# Patient Record
Sex: Male | Born: 1940 | Race: White | Hispanic: No | Smoking: Former smoker
Health system: Southern US, Community
[De-identification: ages and names within clinical notes are randomized; demographics above are authoritative.]

## PROBLEM LIST (undated history)

## (undated) DIAGNOSIS — E039 Hypothyroidism, unspecified: Secondary | ICD-10-CM

## (undated) DIAGNOSIS — I10 Essential (primary) hypertension: Secondary | ICD-10-CM

## (undated) DIAGNOSIS — H548 Legal blindness, as defined in USA: Secondary | ICD-10-CM

## (undated) HISTORY — DX: Essential (primary) hypertension: I10

## (undated) HISTORY — DX: Legal blindness, as defined in USA: H54.8

## (undated) HISTORY — PX: EYE SURGERY: SHX253

## (undated) HISTORY — PX: HEMORRHOID BANDING: SHX5850

## (undated) HISTORY — DX: Hypothyroidism, unspecified: E03.9

---

## 2018-01-21 ENCOUNTER — Encounter: Payer: Self-pay | Admitting: Sports Medicine

## 2018-01-21 ENCOUNTER — Ambulatory Visit (INDEPENDENT_AMBULATORY_CARE_PROVIDER_SITE_OTHER): Payer: Medicare Other | Admitting: Sports Medicine

## 2018-01-21 ENCOUNTER — Ambulatory Visit (INDEPENDENT_AMBULATORY_CARE_PROVIDER_SITE_OTHER): Payer: Medicare Other

## 2018-01-21 VITALS — BP 122/80 | HR 61 | Ht 70.0 in | Wt 166.6 lb

## 2018-01-21 DIAGNOSIS — S82891A Other fracture of right lower leg, initial encounter for closed fracture: Secondary | ICD-10-CM | POA: Diagnosis not present

## 2018-01-21 DIAGNOSIS — M25571 Pain in right ankle and joints of right foot: Secondary | ICD-10-CM

## 2018-01-21 NOTE — Progress Notes (Addendum)
Veverly FellsMichael D. Delorise Shinerigby, DO  [facility] [phone]  Zachary Hebert - 77 y.o. male MRN [id number]  Date of birth: 1941-06-17  Visit Date: 01/21/2018  PCP: Herby AbrahamHipp, David, MD   Referred by: No ref. provider found   Scribe for today's visit: Christoper FabianMolly Weber, LAT, ATC     SUBJECTIVE:  Zachary Hebert is here for New Patient (Initial Visit) (ankle pain) .    R ankle pain  symptoms INITIALLY: Began about 2 weeks ago after falling off a curb at [employer] Described as 5/10 throbbing pain at the R lateral malleolus, radiating to R foot/lower leg Worsened with weight bearing Improved with rest and elevation Additional associated symptoms include: tingling in toes but no popping or cracking    At this time symptoms are improving compared to onset  He has been taking Aspirin and using both ice and epsom salts   ROS Denies night time disturbances. Denies fevers, chills, or night sweats. Denies unexplained weight loss. Denies personal history of cancer. Denies changes in bowel or bladder habits. Reports recent fall as above Denies new or worsening dyspnea or wheezing. Denies headaches or dizziness.  Denies numbness, tingling or weakness  In the extremities.  Denies dizziness or presyncopal episodes Reports lower extremity edema.  Yes in the R LE, ankle and foot   HISTORY & PERTINENT PRIOR DATA:  Prior History reviewed and updated per electronic medical record.  Significant history, findings, studies and interim changes include:  reports that he has quit smoking. he has never used smokeless tobacco. No results for input(s): HGBA1C, LABURIC, CREATINE in the last 8760 hours. No specialty comments available. Problem  Closed Fracture of Right Ankle    OBJECTIVE:  VS:  HT:5\' 10"  (177.8 cm)   WT:166 lb 9.6 oz (75.6 kg)  BMI:23.9    BP:122/80  HR:61bpm  TEMP: ( )  RESP:96 %   PHYSICAL EXAM: Constitutional: WDWN, Non-toxic  appearing. Psychiatric: Alert & appropriately interactive.  Not depressed or anxious appearing. Respiratory: No increased work of breathing.  Trachea Midline Eyes: Pupils are equal.  EOM intact without nystagmus.  No scleral icterus  NEUROVASCULAR exam: No clubbing or cyanosis appreciated No significant venous stasis changes Capillary Refill: normal, less than 2 seconds   Right ankle: Marked swelling over the lateral malleolus.  He has marked pain in this area.  He has limited ankle dorsiflexion plantarflexion.  Pain with palpation of the syndesmosis which is mild.  Ankle drawer testing is stable.  He has no pain along the base of the fifth metatarsal, navicular or lateral malleolus.  No calcaneal pain.  DP and PT pulses are palpable.     ASSESSMENT & PLAN:   1. Acute right ankle pain   2. Closed fracture of right ankle, initial encounter    PLAN:    Closed fracture of right ankle Posterior malleolus nondisplaced fracture.  We will have him begin with boot immobilization.  He has been walking on this for 2 weeks and we discussed that 6 weeks of immobilization from the time of the fracture is recommended.  Recommend wearing the boot 24 hours a day but he will likely drive without the boot on.  Recommend nighttime wear.  New fracture boot provided for him today.  Tylenol as needed.   ++++++++++++++++++++++++++++++++++++++++++++ Orders & Meds: Orders Placed This Encounter  Procedures  . DG Ankle Complete Right    No orders of the defined types were placed in this  encounter.   ++++++++++++++++++++++++++++++++++++++++++++ Follow-up: Return in about 2 weeks (around 02/04/2018) for repeat X-rays.   Pertinent documentation may be included in additional procedure notes, imaging studies, problem based documentation and patient instructions. Please see these sections of the encounter for additional information regarding this visit. CMA/ATC served as Neurosurgeon during this visit. History,  Physical, and Plan performed by medical provider. Documentation and orders reviewed and attested to.      Andrena Mews, DO    [facility]ician

## 2018-01-21 NOTE — Assessment & Plan Note (Signed)
Posterior malleolus nondisplaced fracture.  We will have him begin with boot immobilization.  He has been walking on this for 2 weeks and we discussed that 6 weeks of immobilization from the time of the fracture is recommended.  Recommend wearing the boot 24 hours a day but he will likely drive without the boot on.  Recommend nighttime wear.  New fracture boot provided for him today.  Tylenol as needed.

## 2018-02-03 ENCOUNTER — Encounter: Payer: Self-pay | Admitting: Sports Medicine

## 2018-02-03 ENCOUNTER — Ambulatory Visit (INDEPENDENT_AMBULATORY_CARE_PROVIDER_SITE_OTHER): Payer: Medicare Other | Admitting: Sports Medicine

## 2018-02-03 ENCOUNTER — Ambulatory Visit (INDEPENDENT_AMBULATORY_CARE_PROVIDER_SITE_OTHER): Payer: Medicare Other

## 2018-02-03 VITALS — BP 122/74 | HR 57 | Ht 70.0 in | Wt 170.0 lb

## 2018-02-03 DIAGNOSIS — S82891D Other fracture of right lower leg, subsequent encounter for closed fracture with routine healing: Secondary | ICD-10-CM | POA: Diagnosis not present

## 2018-02-03 NOTE — Progress Notes (Signed)
Zachary FellsMichael D. Zachary Shinerigby, DO  [facility] [phone]  Zachary Hebert - 77 y.o. male MRN [id number]  Date of birth: 18-Apr-1941  Visit Date: 02/03/2018  PCP: Zachary AbrahamHipp, David, MD   Referred by: Zachary AbrahamHipp, David, MD   Scribe for today's visit: Zachary Hebert, CMA     SUBJECTIVE:  Zachary Hebert is here for Follow-up (closed malleolar fx)  01/21/18: R ankle pain symptoms INITIALLY: Began about 2 weeks ago after falling [place] a curb at [employer] Described as 5/10 throbbing pain at the R lateral malleolus, radiating to R foot/lower leg Worsened with weight bearing Improved with rest and elevation Additional associated symptoms include: tingling in toes but no popping or cracking At this time symptoms are improving compared to onset  He has been taking Aspirin and using both ice and epsom salts   02/03/18: Compared to the last office visit, his previously described symptoms are improving, hardly any pain, still some swelling. He has tried walking with a sandal on with little discomfort.  Current symptoms are mild & are nonradiating He has been wearing boot. He takes Aspirin every other night.    ROS Denies night time disturbances. Denies fevers, chills, or night sweats. Denies unexplained weight loss. Denies personal history of cancer. Denies changes in bowel or bladder habits. Denies recent unreported falls. Denies new or worsening dyspnea or wheezing. Denies headaches or dizziness.  Denies numbness, tingling or weakness  In the extremities.  Denies dizziness or presyncopal episodes Reports lower extremity edema     HISTORY & PERTINENT PRIOR DATA:  Prior History reviewed and updated per electronic medical record.  Significant history, findings, studies and interim changes include:  reports that he has quit smoking. he has never used smokeless tobacco. No results for input(s): HGBA1C, LABURIC, CREATINE in the last 8760 hours. No  specialty comments available. No problems updated.  OBJECTIVE:  VS:  HT:5\' 10"  (177.8 cm)   WT:170 lb (77.1 kg)  BMI:24.39    BP:122/74  HR:(!) 57bpm  TEMP: ( )  RESP:97 %   PHYSICAL EXAM: Constitutional: WDWN, Non-toxic appearing. Psychiatric: Alert & appropriately interactive.  Not depressed or anxious appearing. Respiratory: No increased work of breathing.  Trachea Midline Eyes: Pupils are equal.  EOM intact without nystagmus.  No scleral icterus  NEUROVASCULAR exam: No clubbing or cyanosis appreciated No significant venous stasis changes Capillary Refill: normal, less than 2 seconds   Overall is doing quite well.  His right ankle has significantly improved from swelling and discomfort.  He has only minimal pain with palpation of the anterior medial and anterior joint lines.  Small amount of pain over the anterior syndesmosis.  He has limited dorsiflexion and plantarflexion.  DP and PT pulses 2+/4.  Small amount of pain over the proximal IP joint of the second toe.  Otherwise midfoot and ankle are normal appearing.  He does have high cavus arch.   ASSESSMENT & PLAN:   1. Closed fracture of right ankle with routine healing, subsequent encounter    ++++++++++++++++++++++++++++++++++++++++++++ Orders & Meds:  Orders Placed This Encounter  Procedures  . DG Ankle Complete Right   No orders of the defined types were placed in this encounter.   ++++++++++++++++++++++++++++++++++++++++++++ PLAN:   Findings:  His ankle is doing well.  We will have him continue with boot immobilization for the next 2 weeks especially while out of the house.  He is able to come out of this and work on  gentle range of motion when he is at home.  We will plan to see him back in 2 weeks and perform a repeat clinical exam at that time.  If any persistent focal bony tenderness will consider transitioning into an ASO otherwise we will plan to discuss compression.  Discussed that this will likely persist for  even a year after an ankle fracture like this but otherwise he should do well.  Given his high cavus foot he also should be in over-the-counter longitudinal arch support especially given the changes within the second toes are consistent with hammer toe.   No problem-specific Assessment & Plan notes found for this encounter.   Follow-up: Return in about 2 weeks (around 02/17/2018).   Pertinent documentation may be included in additional procedure notes, imaging studies, problem based documentation and patient instructions. Please see these sections of the encounter for additional information regarding this visit. CMA/ATC served as Neurosurgeon during this visit. History, Physical, and Plan performed by medical provider. Documentation and orders reviewed and attested to.      Zachary Mews, DO    [facility] Physician

## 2018-02-04 ENCOUNTER — Ambulatory Visit: Payer: Medicare Other | Admitting: Sports Medicine

## 2018-02-05 NOTE — Progress Notes (Signed)
There is a persistent area within the posterior malleolus that is likely reflective of a resolving fracture.  No further x-rays will be obtained as long as he continues to improve as well as he has.

## 2018-02-06 NOTE — Progress Notes (Signed)
Noted  

## 2018-02-17 ENCOUNTER — Ambulatory Visit (INDEPENDENT_AMBULATORY_CARE_PROVIDER_SITE_OTHER): Payer: Medicare Other | Admitting: Sports Medicine

## 2018-02-17 ENCOUNTER — Encounter: Payer: Self-pay | Admitting: Sports Medicine

## 2018-02-17 VITALS — BP 136/82 | HR 57 | Ht 70.0 in | Wt 176.2 lb

## 2018-02-17 DIAGNOSIS — S82891D Other fracture of right lower leg, subsequent encounter for closed fracture with routine healing: Secondary | ICD-10-CM | POA: Diagnosis not present

## 2018-02-17 DIAGNOSIS — M25571 Pain in right ankle and joints of right foot: Secondary | ICD-10-CM

## 2018-02-17 NOTE — Progress Notes (Signed)
Zachary Hebert. [facility] [phone]  Zachary Hebert - 77 y.o. male MRN [id number]  Date of birth: 1941-02-10  Visit Date: 02/17/2018  PCP: Zachary Abraham, MD   Referred by: Zachary Abraham, MD   Scribe for today's visit: Zachary Hebert, CMA     SUBJECTIVE:  Zachary Hebert is here for Follow-up (R ankle fx)  01/21/18: R ankle pain symptoms INITIALLY: Began about 2 weeks ago after falling off a curb at [employer] Described as 5/10 throbbing pain at the R lateral malleolus, radiating to R foot/lower leg Worsened with weight bearing Improved with rest and elevation Additional associated symptoms include: tingling in toes but no popping or cracking At this time symptoms are improving compared to onset  He has been taking Aspirin and using both ice and epsom salts   02/03/18: Compared to the last office visit, his previously described symptoms are improving, hardly any pain, still some swelling. He has tried walking with a sandal on with little discomfort.  Current symptoms are mild & are nonradiating He has been wearing boot. He takes Aspirin every other night.   02/17/18: Compared to the last office visit, his previously described symptoms are improving, he is able to drive and walk without boot with no pain.  Current symptoms are mild & are nonradiating. Pt reports no swelling.  He has been wearing boot regularly throughout the day but takes it off at bedtime. He has not purchased longitiduinal arch support but has been wearing compression socks.    ROS Denies night time disturbances. Denies fevers, chills, or night sweats. Denies unexplained weight loss. Denies personal history of cancer. Denies changes in bowel or bladder habits. Denies recent unreported falls. Denies new or worsening dyspnea or wheezing. Denies headaches or dizziness.  Denies numbness, tingling or weakness  In the extremities.  Denies  dizziness or presyncopal episodes Denies lower extremity edema    HISTORY & PERTINENT PRIOR DATA:  Prior History reviewed and updated per electronic medical record.  Significant history, findings, studies and interim changes include:  reports that he has quit smoking. he has never used smokeless tobacco. No results for input(s): HGBA1C, LABURIC, CREATINE in the last 8760 hours. No specialty comments available. No problems updated.  OBJECTIVE:  VS:  HT:5\' 10"  (177.8 cm)   WT:176 lb 3.2 oz (79.9 kg)  BMI:25.28    BP:136/82  HR:(!) 57bpm  TEMP: ( )  RESP:97 %   PHYSICAL EXAM: Constitutional: WDWN, Non-toxic appearing. Psychiatric: Alert & appropriately interactive.  Not depressed or anxious appearing. Respiratory: No increased work of breathing.  Trachea Midline Eyes: Pupils are equal.  EOM intact without nystagmus.  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  He has no focal bony tenderness.  He has good dorsiflexion and plantarflexion strength is range of motion is only slightly limited compared to the left.  Inversion and eversion strength is 5+/5.  No significant lower extremity edema.   ASSESSMENT & PLAN:   1. Closed fracture of right ankle with routine healing, subsequent encounter   2. Acute right ankle pain     PLAN: He is done great with this.  We did discuss that degenerative arthritis can flareup from time to time and if he ever has any return  of significant symptoms injection is a possibility for this.  Continue with compression and arch support over-the-counter as needed but he will call us if any ongoing issues.  Follow-up: Return for as needed for ongoing issues.   Pertinent documentation may be included in additional procedure notes, imaging  studies, problem based documentation and patient instructions. Please see these sections of the encounter for additional information regarding this visit. CMA/ATC served as Neurosurgeonscribe during this visit. History, Physical, and Plan performed by medical provider. Documentation and orders reviewed and attested to.      Zachary MewsMichael D Lofton Leon, DO    [facility] Physician

## 2019-09-19 IMAGING — DX DG ANKLE COMPLETE 3+V*R*
3 series · 3 of 3 positions shown · non-contrast
Comparison: No recent.

CLINICAL DATA: Right ankle injury.  Pain swelling.

EXAM:
RIGHT ANKLE - COMPLETE 3+ VIEW

[ankle ap]
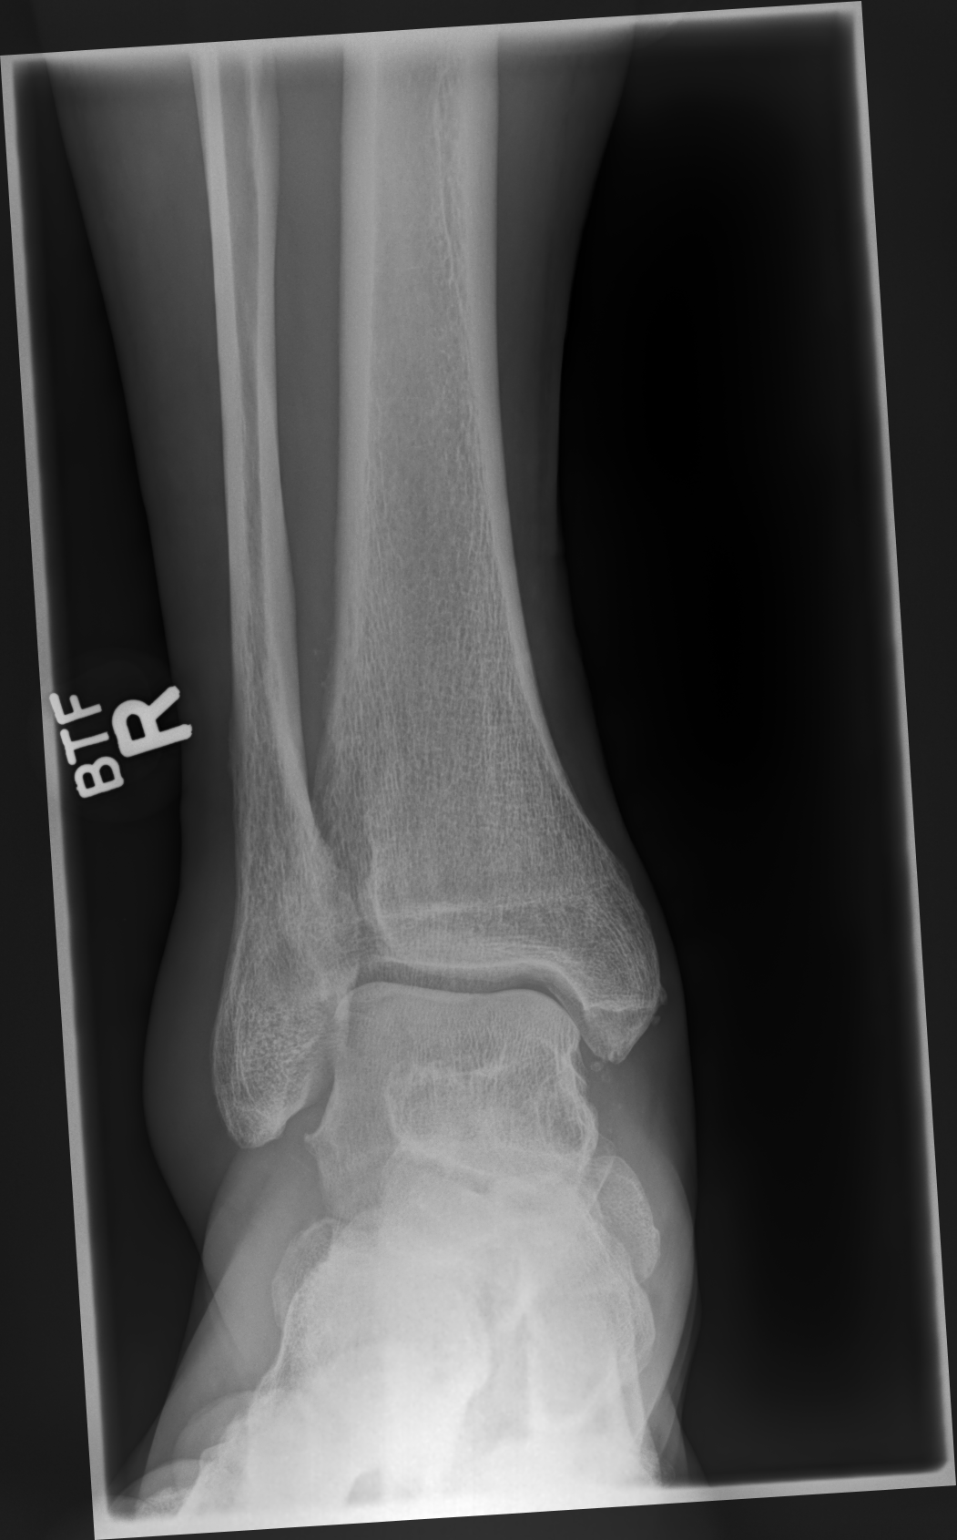

[ankle oblique]
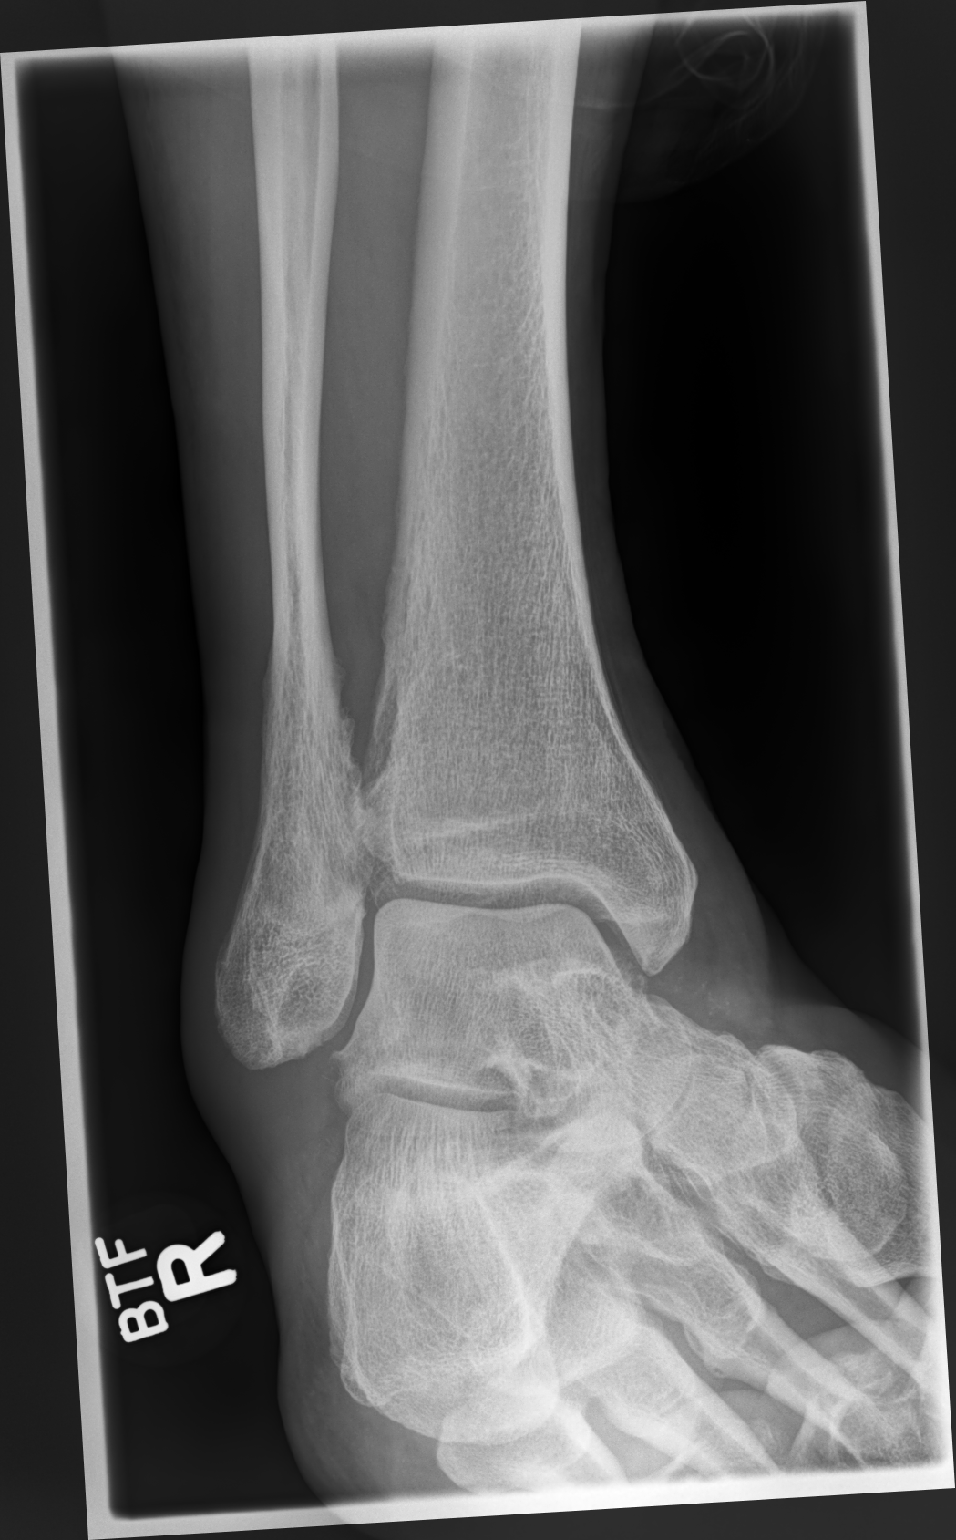

[ankle lat]
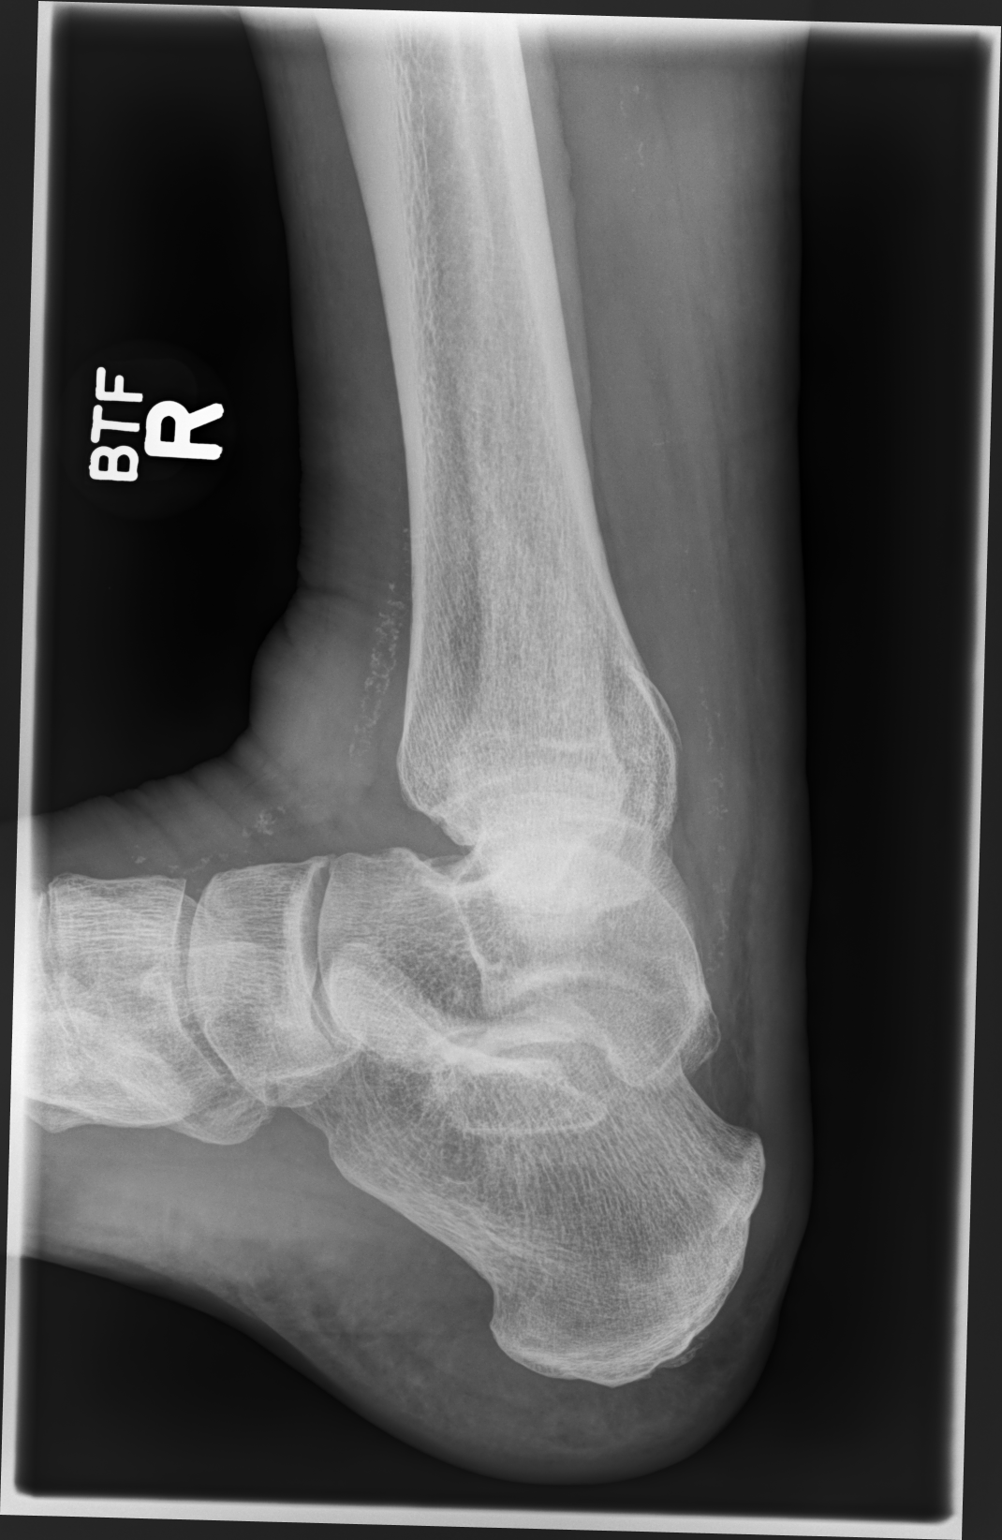

[3 of 3 positions shown; findings below may reference images not displayed]

FINDINGS: Soft tissue swelling scratched it diffuse soft tissue swelling,
particularly prominent over the lateral malleolus. Tiny corticated
bony densities noted adjacent to the medial malleolus most likely
tiny old fracture fragments. No definite acute fractures identified.
Degenerative changes noted about the right ankle. Peripheral
vascular calcification.
IMPRESSION: 1. Diffuse soft tissue swelling, particularly prominent over the
lateral malleolus. Tiny bony scratched it tiny corticated bony
densities noted adjacent to the medial malleolus, most likely old
fracture fragments. No acute fractures identified. Degenerative
changes right ankle.

2.  Peripheral vascular disease.

## 2019-10-02 IMAGING — DX DG ANKLE COMPLETE 3+V*R*
3 series · 3 of 3 positions shown · non-contrast
Comparison: 01/21/2018

CLINICAL DATA: Two week fracture follow-up

EXAM:
RIGHT ANKLE - COMPLETE 3+ VIEW

[ankle ap]
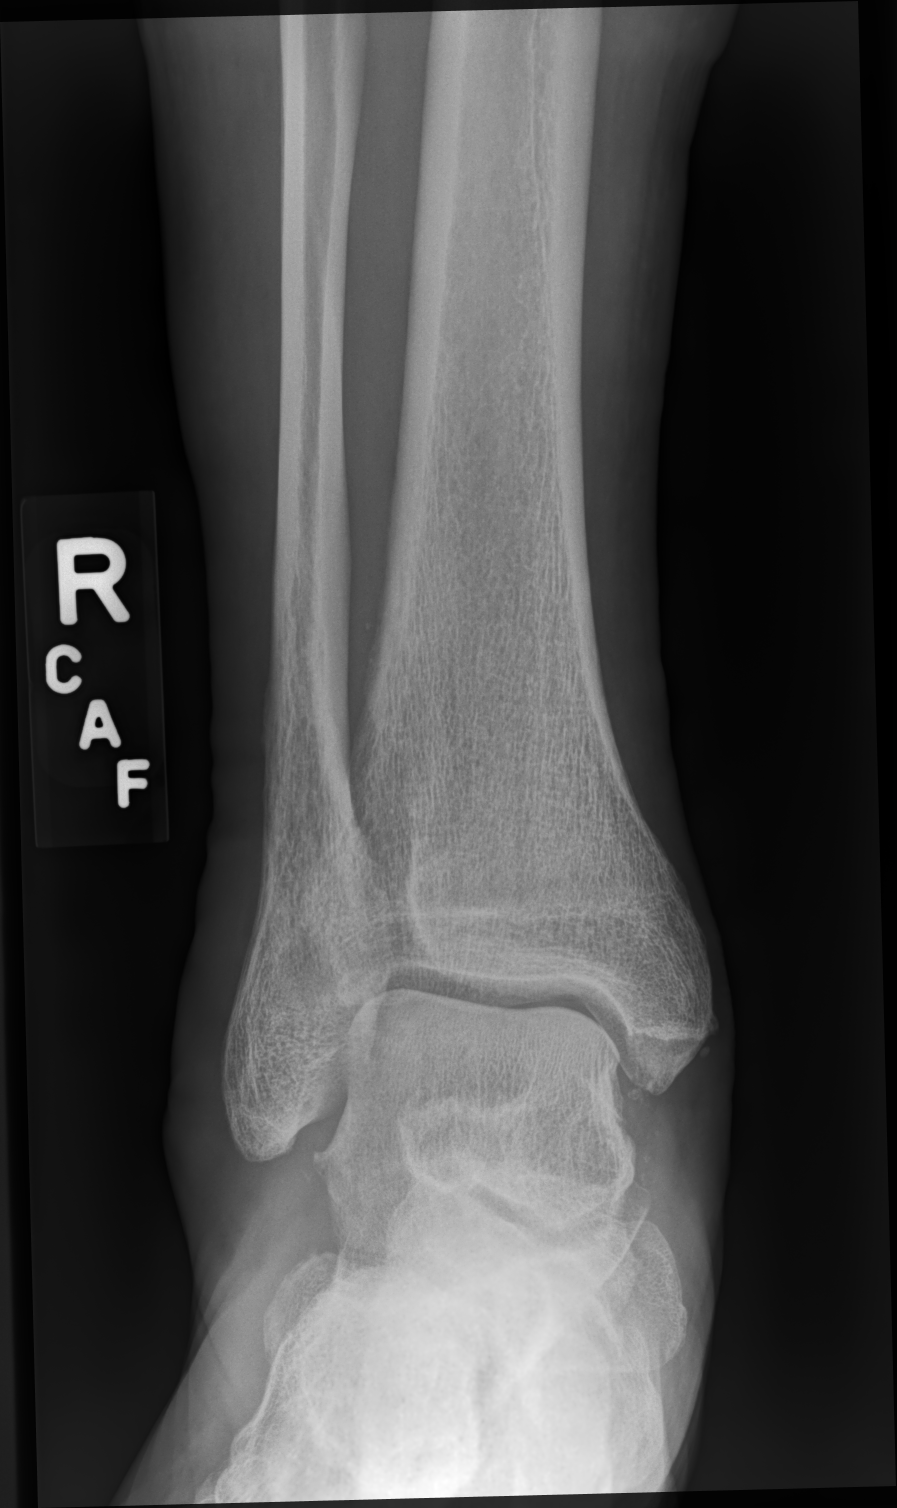

[ankle oblique]
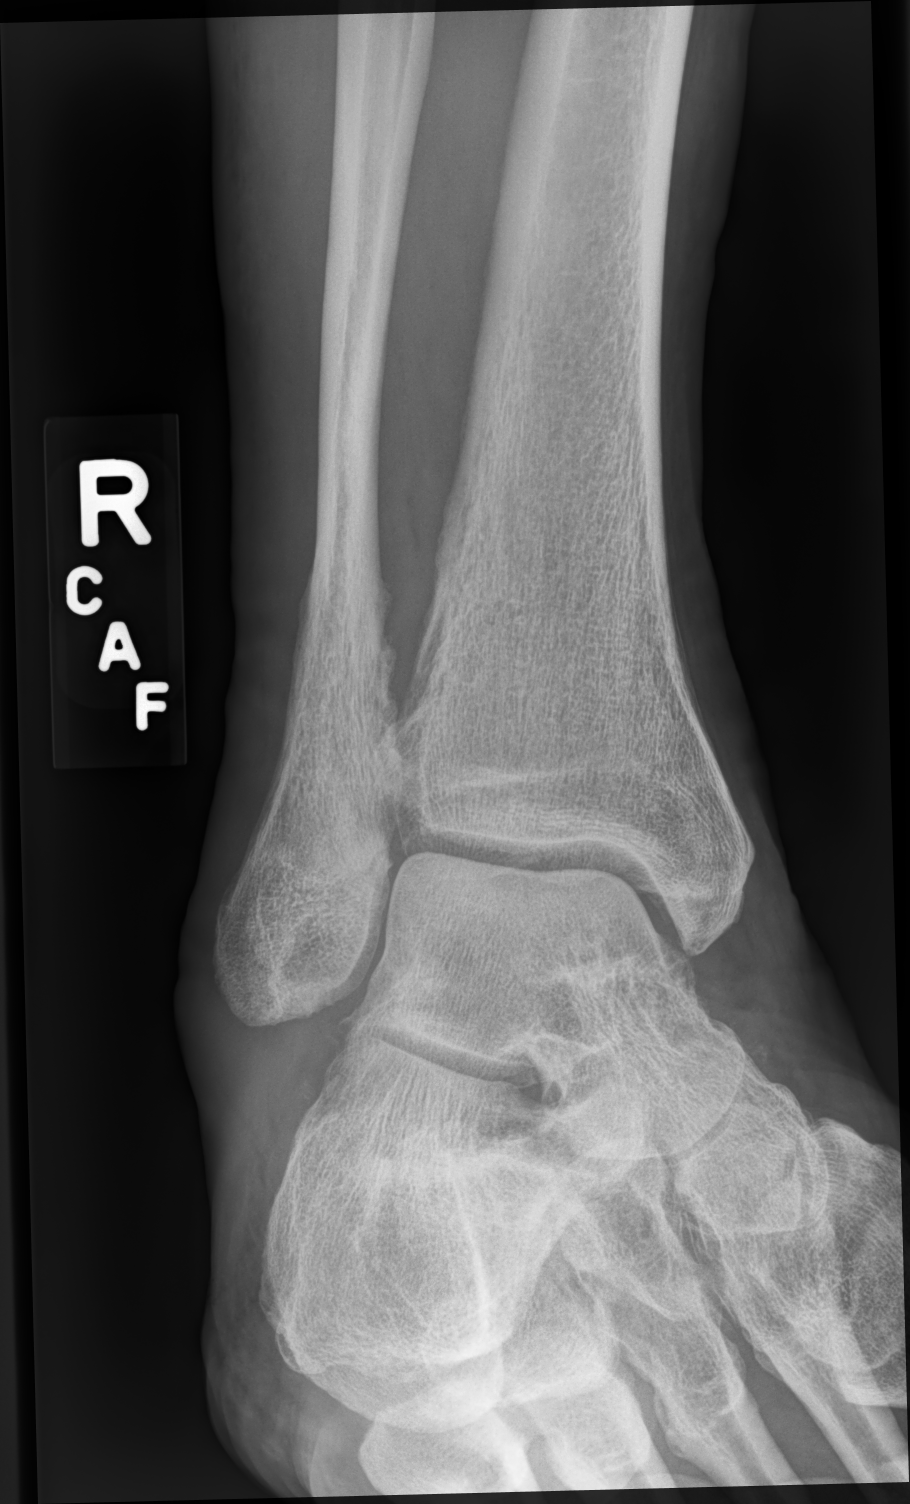

[ankle lat]
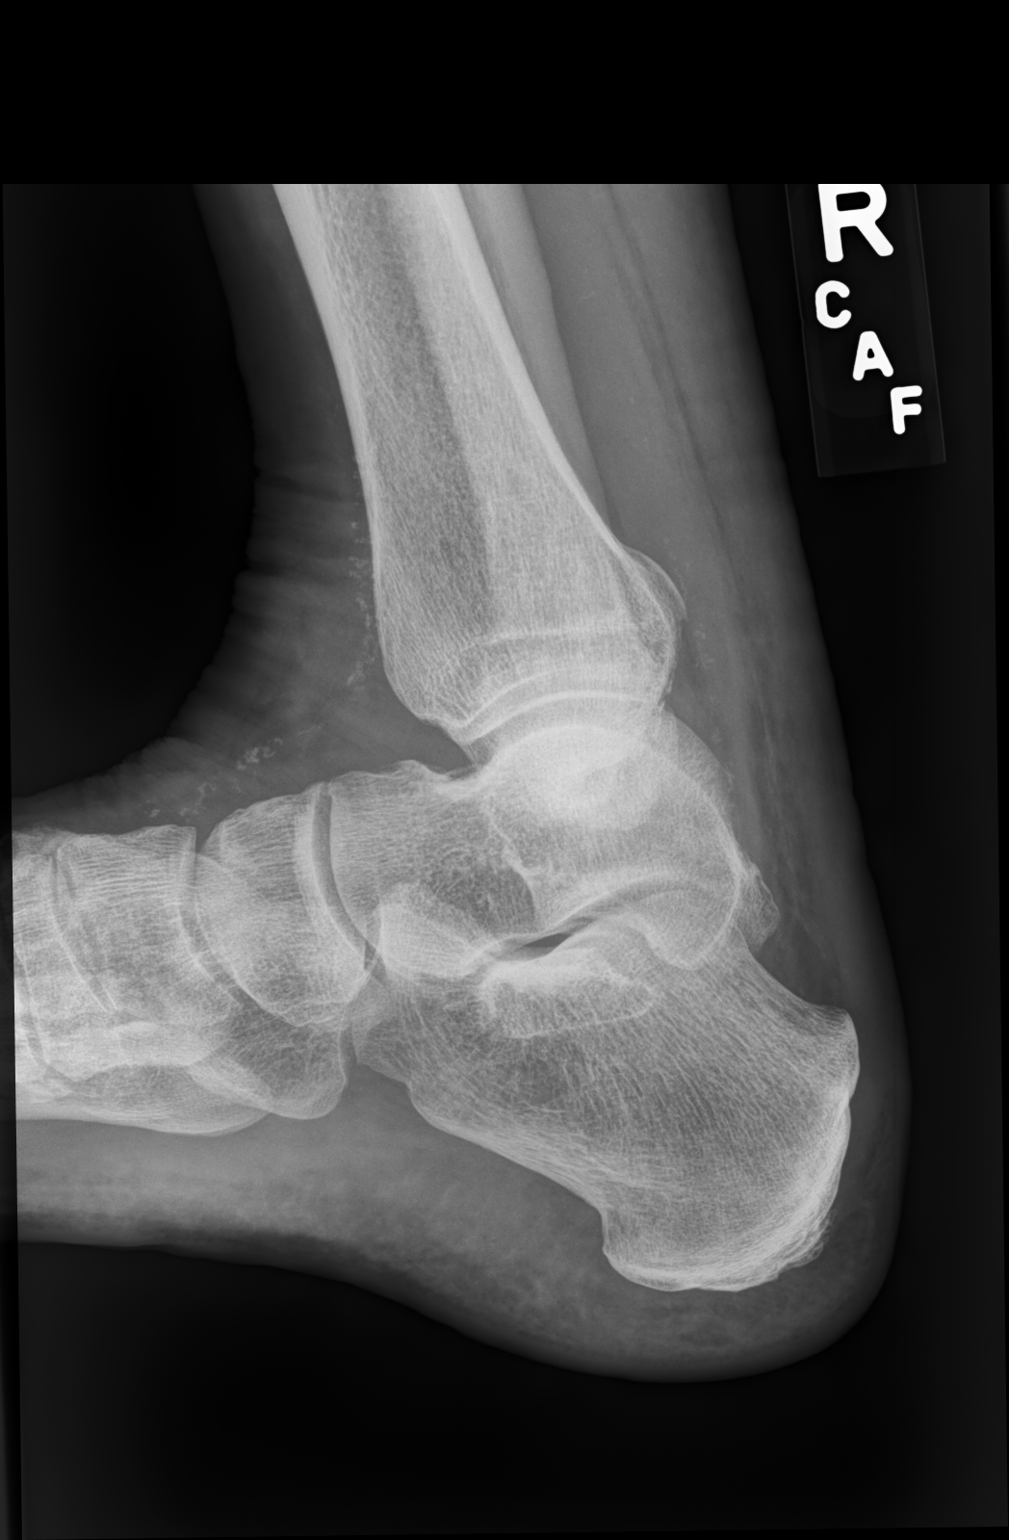

[3 of 3 positions shown; findings below may reference images not displayed]

FINDINGS: Lateral soft tissue swelling. No fracture, subluxation or
dislocation. Joint space is maintained. Vascular calcifications
noted.
IMPRESSION: No acute bony abnormality.

## 2021-12-10 DIAGNOSIS — I499 Cardiac arrhythmia, unspecified: Secondary | ICD-10-CM

## 2021-12-10 HISTORY — DX: Cardiac arrhythmia, unspecified: I49.9

## 2022-02-28 ENCOUNTER — Encounter: Payer: Self-pay | Admitting: Physician Assistant

## 2022-03-30 NOTE — Progress Notes (Signed)
? ? ? ?04/02/2022 ?Zachary Hebert ?[id number] ?Jun 16, 1941 ? ?Referring provider: Herby Abraham, MD ?Primary GI doctor: Dr. Leone Hebert ? ?ASSESSMENT AND PLAN:  ? ?Hemorrhoids, unspecified hemorrhoid type ?-     hydrocortisone (ANUSOL-HC) 2.5 % rectal cream; Place 1 application. rectally 2 (two) times daily. ?Has history of banding 8 years ago, has prolapsed hemorrhoid in the office. No masses, neg hemoccult.  ?-Sitz baths, increase fiber, increase water, need to fix toilet habits.  ?-Hydrocortisone supp give and external cream sent in.  ?-We discussed hemorrhoid banding, scheduled for first available with Dr. Leone Hebert, per his preference. Patient is moving and would like done sooner.  ? ?Special screening for malignant neoplasms, colon ?Normal cologuard 01/2019, long discussion with patient.  ?Patient prefers not to schedule anything at this time, discussed Cologuard and benefits as well as the shortcomings.  Patient is 49 but is healthy enough, can consider repeat testing with Cologuard versus 1 last colonoscopy if he so chose.  Patient will consider this and follow-up. ? ?Atrial fibrillation, unspecified type (HCC) ?On xarelto x march ? ? ?Patient Care Team: ?Zachary Abraham, MD as PCP - General (Internal Medicine) ? ?HISTORY OF PRESENT ILLNESS: ?81 y.o. male with a past medical history of hypertension, hypothyroidism, Afib and others listed below presents for evaluation of hemorrhoids.  ? ?Recently started on xarelto for afib x March, had normal echo/stress test.  ?Had negative cologuard 01/2019, due this feb per patient and asking if he can.  ?[place] was last colonoscopy, several years and have all been negative per patient.  ?Has history of hemorrhoid banding in [place] 8 years ago, Zachary Hebert.   ?Normal BM's, no straining, no loose stools.  ?He denies blood in stool.  ?He has discomfort with sitting/lifting, has rectal discharge and having to wear pads.  ? ?Accepted offer on his home and will be moving to  [place] with son. ? ? ? ?Current Medications:  ? ?Current Outpatient Medications (Endocrine & Metabolic):  ?  levothyroxine (SYNTHROID, LEVOTHROID) 100 MCG tablet,  ? ?Current Outpatient Medications (Cardiovascular):  ?  amLODipine (NORVASC) 5 MG tablet,  ?  hydrochlorothiazide (HYDRODIURIL) 25 MG tablet, Take 25 mg by mouth daily. ?  lisinopril (PRINIVIL,ZESTRIL) 40 MG tablet, Take 40 mg by mouth daily. ? ? ? ?Current Outpatient Medications (Hematological):  ?  rivaroxaban (XARELTO) 20 MG TABS tablet, Take 20 mg by mouth daily with supper. ? ?Current Outpatient Medications (Other):  ?  hydrocortisone (ANUSOL-HC) 2.5 % rectal cream, Place 1 application. rectally 2 (two) times daily. ? ? ?Medical History:  ?Past Medical History:  ?Diagnosis Date  ? Cardiac arrhythmia 2023  ? Hypertension   ? Hypothyroidism   ? Legally blind in left eye, as defined in [place]   ? ?Allergies: No Known Allergies  ? ?Surgical History:  ?He  has a past surgical history that includes Eye surgery. ?Family History:  ?His family history is not on file. ?Social History:  ? reports that he has quit smoking. He has never used smokeless tobacco. He reports current alcohol use. He reports that he does not use drugs. ? ?REVIEW OF SYSTEMS  : All other systems reviewed and negative except where noted in the History of Present Illness. ? ? ?PHYSICAL EXAM: ?BP (!) 146/98   Pulse (!) 57   Ht 5\' 11"  (1.803 m)   Wt 168 lb (76.2 kg)   SpO2 99%   BMI 23.43 kg/m?  ?General:   Pleasant, well developed male in no acute distress ?Head:  Normocephalic and atraumatic. ?Eyes:  sclerae anicteric,conjunctive pink  ?Heart:   regular rate and rhythm ?Pulm:  Clear anteriorly; no wheezing ?Abdomen:   Soft, Flat AB, Active bowel sounds. No tenderness , No organomegaly appreciated. ?Rectal: Normal external rectal exam, normal rectal tone, appreciated internal hemorrhoids with prolapse, tender, no masses, , brown stool, hemoccult Negative ?Extremities:  Without  edema. ?Msk: Symmetrical without gross deformities. Peripheral pulses intact.  ?Neurologic:  Alert and  oriented x4;  No focal deficits.  ?Skin:   Dry and intact without significant lesions or rashes. ?Psychiatric:  Cooperative. Normal mood and affect. ? ? ? ?Zachary Albee, PA-C ?11:05 AM ? ? ?

## 2022-04-02 ENCOUNTER — Encounter: Payer: Self-pay | Admitting: Physician Assistant

## 2022-04-02 ENCOUNTER — Ambulatory Visit (INDEPENDENT_AMBULATORY_CARE_PROVIDER_SITE_OTHER): Payer: Medicare Other | Admitting: Physician Assistant

## 2022-04-02 VITALS — BP 146/98 | HR 57 | Ht 71.0 in | Wt 168.0 lb

## 2022-04-02 DIAGNOSIS — Z1211 Encounter for screening for malignant neoplasm of colon: Secondary | ICD-10-CM

## 2022-04-02 DIAGNOSIS — I4891 Unspecified atrial fibrillation: Secondary | ICD-10-CM | POA: Diagnosis not present

## 2022-04-02 DIAGNOSIS — K649 Unspecified hemorrhoids: Secondary | ICD-10-CM | POA: Diagnosis not present

## 2022-04-02 MED ORDER — HYDROCORTISONE (PERIANAL) 2.5 % EX CREA: 1.0000 "application " | TOPICAL_CREAM | Freq: Two times a day (BID) | CUTANEOUS | 2 refills | Status: AC

## 2022-04-02 NOTE — Patient Instructions (Addendum)
If the hemorrhoid suppository sent in is too expensive you can do this over the counter trick.  ?Apply a pea size amount of over the counter Anusol HC cream to the tip of an over the counter PrepH suppository and insert rectally once every night for at least 7 nights.  ? ?Please do sitz baths, increase fiber or add benefiber, increase water and increase acitivity.  ?Will set up for appointment for hemorrhoid banding here in the office.  ? ? ?About Hemorrhoids ? ?Hemorrhoids are swollen veins in the lower rectum and anus.  Also called piles, hemorrhoids are a common problem.  Hemorrhoids may be internal (inside the rectum) or external (around the anus). ? ?Internal Hemorrhoids ? ?Internal hemorrhoids are often painless, but they rarely cause bleeding.  The internal veins may stretch and fall down (prolapse) through the anus to the outside of the body.  The veins may then become irritated and painful. ? ?External Hemorrhoids ? ?External hemorrhoids can be easily seen or felt around the anal opening.  They are under the skin around the anus.  When the swollen veins are scratched or broken by straining, rubbing or wiping they sometimes bleed. ? ?How Hemorrhoids Occur ? ?Veins in the rectum and around the anus tend to swell under pressure.  Hemorrhoids can result from increased pressure in the veins of your anus or rectum.  Some sources of pressure are: ? ?Straining to have a bowel movement because of constipation ?Waiting too long to have a bowel movement ?Coughing and sneezing often ?Sitting for extended periods of time, including on the toilet ?Diarrhea ?Obesity ?Trauma or injury to the anus ?Some liver diseases ?Stress ?Family history of hemorrhoids ?Pregnancy ? Pregnant women should try to avoid becoming constipated, because they are more likely to have hemorrhoids during pregnancy.  In the last trimester of pregnancy, the enlarged uterus may press on blood vessels and causes hemorrhoids.  In addition, the strain of  childbirth sometimes causes hemorrhoids after the birth. ? ?Symptoms of Hemorrhoids ? ?Some symptoms of hemorrhoids include: ?Swelling and/or a tender lump around the anus ?Itching, mild burning and bleeding around the anus ?Painful bowel movements with or without constipation ?Bright red blood covering the stool, on toilet paper or in the toilet bowel.  ? ?Symptoms usually go away within a few days.  Always talk to your doctor about any bleeding to make sure it is not from some other causes. ? ?Diagnosing and Treating Hemorrhoids ? ?Diagnosis is made by an examination by your healthcare provider.  Special test can be performed by your doctor.   ? ?Most cases of hemorrhoids can be treated with: ?High-fiber diet: Eat more high-fiber foods, which help prevent constipation.  Ask for more detailed fiber information on types and sources of fiber from your healthcare provider. ?Fluids: Drink plenty of water.  This helps soften bowel movements so they are easier to pass. ?Sitz baths and cold packs: Sitting in lukewarm water two or three times a day for 15 minutes cleases the anal area and may relieve discomfort.  If the water is too hot, swelling around the anus will get worse.  Placing a cloth-covered ice pack on the anus for ten minutes four times a day can also help reduce selling.  Gently pushing a prolapsed hemorrhoid back inside after the bath or ice pack can be helpful. ?Medications: For mild discomfort, your healthcare provider may suggest over-the-counter pain medication or prescribe a cream or ointment for topical use.  The cream may  contain witch hazel, zinc oxide or petroleum jelly.  Medicated suppositories are also a treatment option.  Always consult your doctor before applying medications or creams. ?Procedures and surgeries: There are also a number of procedures and surgeries to shrink or remove hemorrhoids in more serious cases.  Talk to your physician about these options. ? ?You can often prevent  hemorrhoids or keep them from becoming worse by maintaining a healthy lifestyle.  Eat a fiber-rich diet of fruits, vegetables and whole grains.  Also, drink plenty of water and exercise regularly. ? ?? 2007, [employer] Doc.30 ? ?

## 2022-04-13 ENCOUNTER — Encounter: Payer: Medicare Other | Admitting: Internal Medicine

## 2022-05-10 ENCOUNTER — Ambulatory Visit (INDEPENDENT_AMBULATORY_CARE_PROVIDER_SITE_OTHER): Payer: Medicare Other | Admitting: Internal Medicine

## 2022-05-10 ENCOUNTER — Encounter: Payer: Self-pay | Admitting: Internal Medicine

## 2022-05-10 VITALS — BP 130/82 | HR 73 | Ht 71.0 in | Wt 169.4 lb

## 2022-05-10 DIAGNOSIS — K642 Third degree hemorrhoids: Secondary | ICD-10-CM | POA: Insufficient documentation

## 2022-05-10 NOTE — Progress Notes (Signed)
   HEMORRHOID LIGATION  Sxs: Prolapsed Hemorrhoid  Rectal Gr 3 Prolapsed Int Hemorrhoid  Anoscopy - same  PROCEDURE NOTE: The patient presents with symptomatic grade 3 hemorrhoids, requesting rubber band ligation of his/her hemorrhoidal disease.  All risks, benefits and alternative forms of therapy were described and informed consent was obtained.   The anorectum was pre-medicated with 0.125% nitroglycerin and 5% lidocaine The decision was made to band the right posterior internal hemorrhoid, and the [facility] System was used to perform band ligation without complication.  Digital anorectal examination was then performed to assure proper positioning of the band, and to adjust the banded tissue as required.  The patient was discharged home without pain or other issues.  Dietary and behavioral recommendations were given and along with follow-up instructions.      He will follow-up as needed.

## 2022-05-10 NOTE — Patient Instructions (Signed)
HEMORRHOID BANDING PROCEDURE    FOLLOW-UP CARE   The procedure you have had should have been relatively painless since the banding of the area involved does not have nerve endings and there is no pain sensation.  The rubber band cuts off the blood supply to the hemorrhoid and the band may fall off as soon as 48 hours after the banding (the band may occasionally be seen in the toilet bowl following a bowel movement). You may notice a temporary feeling of fullness in the rectum which should respond adequately to plain Tylenol or Motrin.  Following the banding, avoid strenuous exercise that evening and resume full activity the next day.  A sitz bath (soaking in a warm tub) or bidet is soothing, and can be useful for cleansing the area after bowel movements.     To avoid constipation, take two tablespoons of natural wheat bran, natural oat bran, flax, Benefiber or any over the counter fiber supplement and increase your water intake to 7-8 glasses daily.    Unless you have been prescribed anorectal medication, do not put anything inside your rectum for two weeks: No suppositories, enemas, fingers, etc.  Occasionally, you may have more bleeding than usual after the banding procedure.  This is often from the untreated hemorrhoids rather than the treated one.  Don't be concerned if there is a tablespoon or so of blood.  If there is more blood than this, lie flat with your bottom higher than your head and apply an ice pack to the area. If the bleeding does not stop within a half an hour or if you feel faint, call our office at [phone] or go to the emergency room.  Problems are not common; however, if there is a substantial amount of bleeding, severe pain, chills, fever or difficulty passing urine (very rare) or other problems, you should call us at [phone] or report to the nearest emergency room.  Do not stay seated continuously for more than 2-3 hours for a day or two after the procedure.   Tighten your buttock muscles 10-15 times every two hours and take 10-15 deep breaths every 1-2 hours.  Do not spend more than a few minutes on the toilet if you cannot empty your bowel; instead re-visit the toilet at a later time.  I appreciate the opportunity to care for you. Carl Gessner, MD, FACG
